# Patient Record
Sex: Male | Born: 1960
Health system: Southern US, Community
[De-identification: ages and names within clinical notes are randomized; demographics above are authoritative.]

## PROBLEM LIST (undated history)

## (undated) DIAGNOSIS — E782 Mixed hyperlipidemia: Secondary | ICD-10-CM

## (undated) DIAGNOSIS — I10 Essential (primary) hypertension: Secondary | ICD-10-CM

## (undated) DIAGNOSIS — F17200 Nicotine dependence, unspecified, uncomplicated: Secondary | ICD-10-CM

## (undated) HISTORY — DX: Mixed hyperlipidemia: E78.2

## (undated) HISTORY — DX: Essential (primary) hypertension: I10

## (undated) HISTORY — DX: Nicotine dependence, unspecified, uncomplicated: F17.200

## (undated) HISTORY — PX: NO PAST SURGERIES: SHX2092

---

## 2005-05-25 ENCOUNTER — Ambulatory Visit: Payer: Self-pay | Admitting: Family Medicine

## 2005-05-25 ENCOUNTER — Ambulatory Visit (HOSPITAL_COMMUNITY): Admission: RE | Admit: 2005-05-25 | Discharge: 2005-05-25 | Payer: Self-pay | Admitting: Family Medicine

## 2006-10-13 ENCOUNTER — Ambulatory Visit: Payer: Self-pay | Admitting: Family Medicine

## 2006-10-13 DIAGNOSIS — B078 Other viral warts: Secondary | ICD-10-CM | POA: Insufficient documentation

## 2006-11-08 ENCOUNTER — Ambulatory Visit: Payer: Self-pay | Admitting: Family Medicine

## 2006-11-09 IMAGING — CR DG WRIST COMPLETE 3+V*R*
4 series · 4 of 4 positions shown · non-contrast
Comparison: none

CLINICAL DATA: Right wrist trauma and pain.  Swelling. 
 RIGHT WRIST ? 4 VIEW:

[view not recorded (1 of 4)]
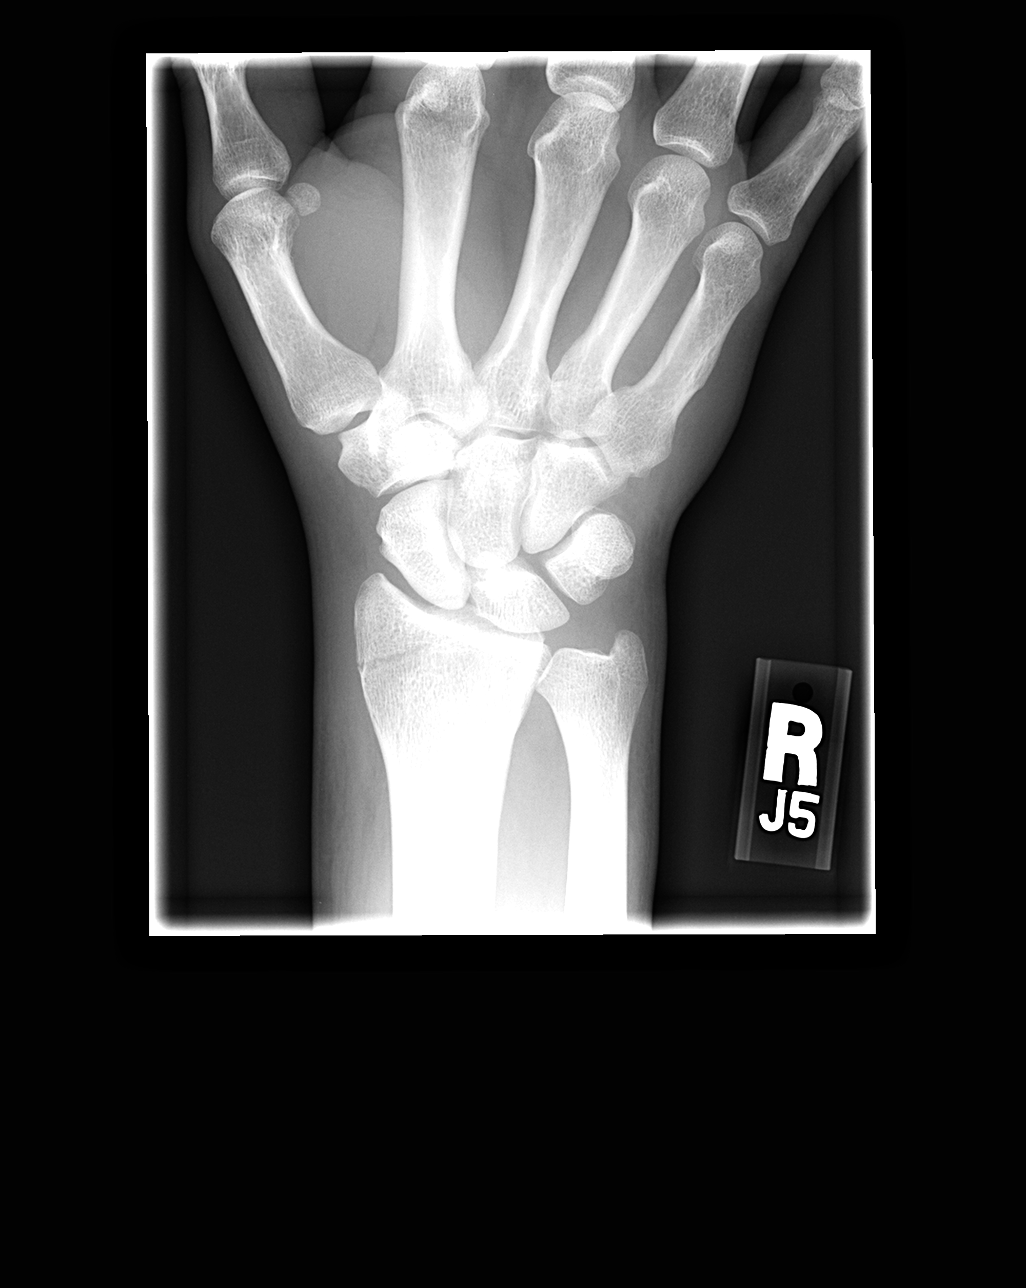

[view not recorded (2 of 4)]
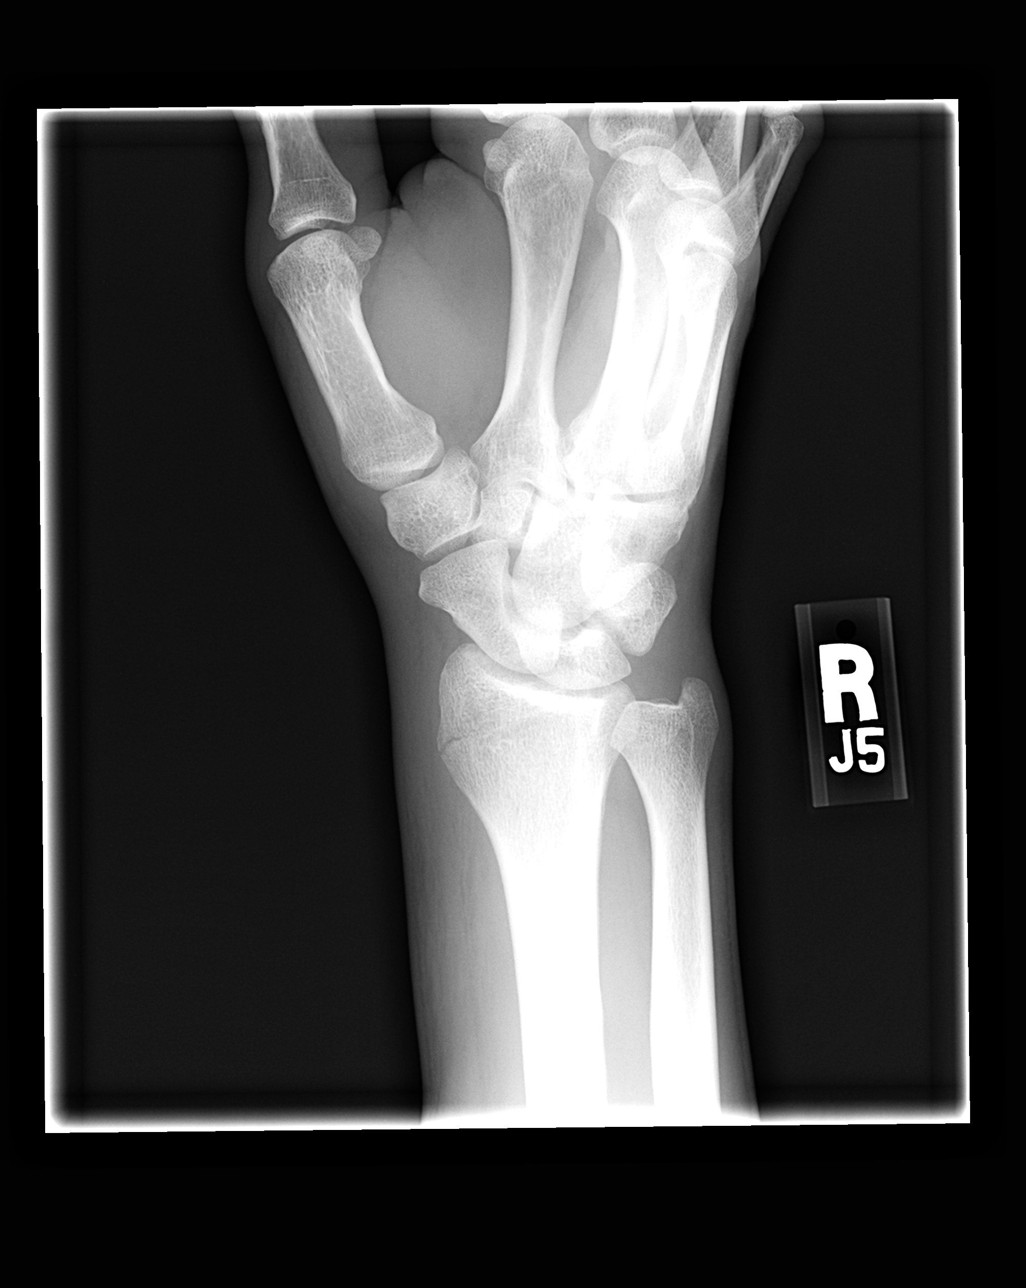

[view not recorded (3 of 4)]
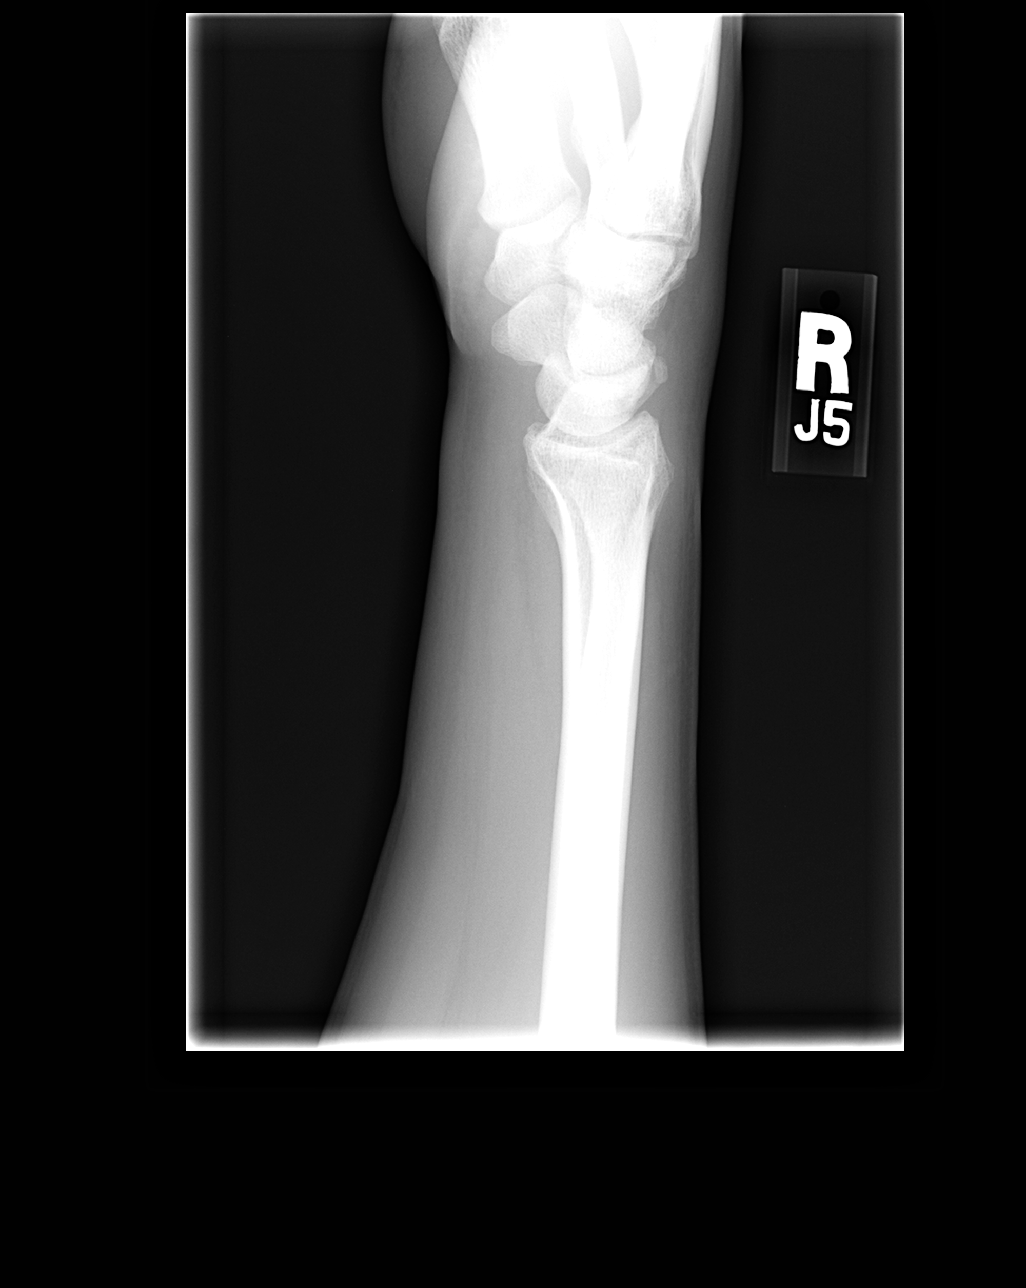

[view not recorded (4 of 4)]
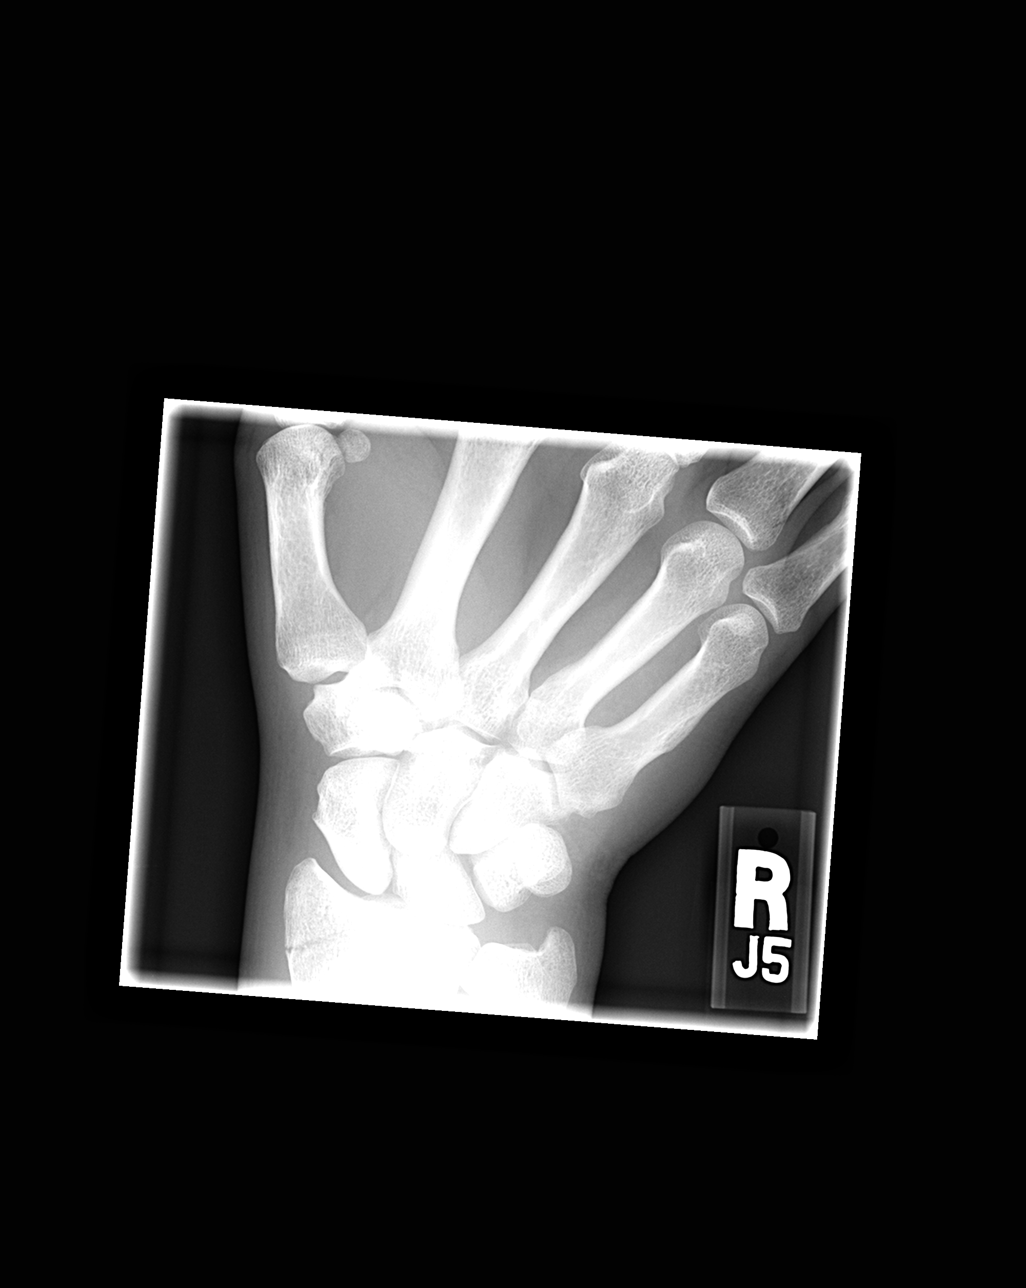

[4 of 4 positions shown; findings below may reference images not displayed]

FINDINGS: A nondisplaced oblique fracture is seen extending through the radial styloid process with intraarticular extension into the radiocarpal joint.  No other fractures are seen.  Alignment of the bones is normal.
IMPRESSION: Nondisplaced radial styloid fracture with intraarticular extension.

## 2015-02-20 ENCOUNTER — Encounter: Payer: Self-pay | Admitting: Podiatry

## 2015-02-20 ENCOUNTER — Ambulatory Visit (INDEPENDENT_AMBULATORY_CARE_PROVIDER_SITE_OTHER): Payer: BLUE CROSS/BLUE SHIELD | Admitting: Podiatry

## 2015-02-20 ENCOUNTER — Ambulatory Visit (INDEPENDENT_AMBULATORY_CARE_PROVIDER_SITE_OTHER): Payer: BLUE CROSS/BLUE SHIELD

## 2015-02-20 VITALS — BP 154/101 | HR 69 | Resp 12

## 2015-02-20 DIAGNOSIS — B078 Other viral warts: Secondary | ICD-10-CM

## 2015-02-20 DIAGNOSIS — M779 Enthesopathy, unspecified: Secondary | ICD-10-CM

## 2015-02-20 DIAGNOSIS — M7740 Metatarsalgia, unspecified foot: Secondary | ICD-10-CM | POA: Diagnosis not present

## 2015-02-20 DIAGNOSIS — B079 Viral wart, unspecified: Secondary | ICD-10-CM | POA: Diagnosis not present

## 2015-02-20 MED ORDER — TRIAMCINOLONE ACETONIDE 10 MG/ML IJ SUSP
10.0000 mg | Freq: Once | INTRAMUSCULAR | Status: AC
  Administered 2015-02-20: 10 mg

## 2015-02-20 NOTE — Progress Notes (Signed)
   Subjective:    Patient ID: Frank Ramsey, male    DOB: 12/16/1960, 54 y.o.   MRN: [id number]  HPI Pt stated rt top of the foot is been painful for 5 months. Foot is better but still hurts especially in the morning. Tried cortisone prescribe DR. IN [place].  ALSO, RT BALL OF THE FOOT HAVE PLANTARS WARTS.   Review of Systems  HENT: Positive for sinus pressure.        Objective:   Physical Exam        Assessment & Plan:

## 2015-02-22 NOTE — Progress Notes (Signed)
Subjective:     Patient ID: Frank Ramsey, male   DOB: 06/23/1960, 54 y.o.   MRN: [id number]  HPI patient states I'm getting a lot of pain on the top of my right foot over my left foot and also I have this lesion on the bottom of this right foot that's been present for several years and the attempted to freeze it and put acid on it without improvement    Review of Systems     Objective:   Physical Exam  Constitutional: He is oriented to person, place, and time.  Cardiovascular: Intact distal pulses.   Musculoskeletal: Normal range of motion.  Neurological: He is oriented to person, place, and time.  Skin: Skin is warm.  Nursing note and vitals reviewed.  neurovascular status intact muscle strength adequate range of motion within normal limits with patient found to have inflammation in the dorsum of the right foot around the extensor tendon complex midtarsal joint and lesion underneath the right foot around the fourth metatarsal which measures approximately 1 cm x 1 cm and upon debridement shows pinpoint bleeding and pain to lateral pressure found to have good digital perfusion well oriented 3     Assessment:     Verruca plantaris plantar aspect right foot with tendinitis of the dorsal midtarsal joint    Plan:     H&P and x-rays reviewed with patient. Today I injected the dorsal tendon complex 3 mg Kenalog 5 mg Xylocaine and I debrided the plantar lesion and applied chemical agent to create an immune response and applied sterile dressing. Instructed what to do if any blistering should occur and reappoint in 1 month

## 2015-04-03 ENCOUNTER — Ambulatory Visit: Payer: BLUE CROSS/BLUE SHIELD | Admitting: Podiatry

## 2017-04-26 DIAGNOSIS — J3089 Other allergic rhinitis: Secondary | ICD-10-CM | POA: Insufficient documentation

## 2017-04-26 DIAGNOSIS — J324 Chronic pansinusitis: Secondary | ICD-10-CM | POA: Insufficient documentation

## 2017-04-26 DIAGNOSIS — J300 Vasomotor rhinitis: Secondary | ICD-10-CM | POA: Insufficient documentation

## 2017-04-26 DIAGNOSIS — J342 Deviated nasal septum: Secondary | ICD-10-CM | POA: Insufficient documentation

## 2017-04-26 DIAGNOSIS — J343 Hypertrophy of nasal turbinates: Secondary | ICD-10-CM | POA: Insufficient documentation

## 2017-05-10 DIAGNOSIS — K219 Gastro-esophageal reflux disease without esophagitis: Secondary | ICD-10-CM | POA: Insufficient documentation

## 2017-05-10 DIAGNOSIS — R1314 Dysphagia, pharyngoesophageal phase: Secondary | ICD-10-CM | POA: Insufficient documentation

## 2019-05-23 DIAGNOSIS — Z23 Encounter for immunization: Secondary | ICD-10-CM | POA: Diagnosis not present

## 2019-06-04 ENCOUNTER — Ambulatory Visit: Payer: BC Managed Care – PPO

## 2019-06-04 ENCOUNTER — Encounter: Payer: Self-pay | Admitting: Orthopaedic Surgery

## 2019-06-04 ENCOUNTER — Other Ambulatory Visit: Payer: Self-pay

## 2019-06-04 ENCOUNTER — Ambulatory Visit: Payer: BC Managed Care – PPO | Admitting: Orthopaedic Surgery

## 2019-06-04 VITALS — BP 188/103 | HR 84 | Ht 70.0 in | Wt 170.0 lb

## 2019-06-04 DIAGNOSIS — M542 Cervicalgia: Secondary | ICD-10-CM

## 2019-06-04 DIAGNOSIS — F17210 Nicotine dependence, cigarettes, uncomplicated: Secondary | ICD-10-CM

## 2019-06-04 MED ORDER — NAPROXEN 500 MG PO TABS: 500.0000 mg | ORAL_TABLET | Freq: Two times a day (BID) | ORAL | 5 refills | Status: DC

## 2019-06-04 NOTE — Patient Instructions (Signed)
Steps to Quit Smoking Smoking tobacco is the leading cause of preventable death. It can affect almost every organ in the body. Smoking puts you and people around you at risk for many serious, long-lasting (chronic) diseases. Quitting smoking can be hard, but it is one of the best things that you can do for your health. It is never too late to quit. How do I get ready to quit? When you decide to quit smoking, make a plan to help you succeed. Before you quit:  Pick a date to quit. Set a date within the next 2 weeks to give you time to prepare.  Write down the reasons why you are quitting. Keep this list in places where you will see it often.  Tell your family, friends, and co-workers that you are quitting. Their support is important.  Talk with your doctor about the choices that may help you quit.  Find out if your health insurance will pay for these treatments.  Know the people, places, things, and activities that make you want to smoke (triggers). Avoid them. What first steps can I take to quit smoking?  Throw away all cigarettes at home, at work, and in your car.  Throw away the things that you use when you smoke, such as ashtrays and lighters.  Clean your car. Make sure to empty the ashtray.  Clean your home, including curtains and carpets. What can I do to help me quit smoking? Talk with your doctor about taking medicines and seeing a counselor at the same time. You are more likely to succeed when you do both.  If you are pregnant or breastfeeding, talk with your doctor about counseling or other ways to quit smoking. Do not take medicine to help you quit smoking unless your doctor tells you to do so. To quit smoking: Quit right away  Quit smoking totally, instead of slowly cutting back on how much you smoke over a period of time.  Go to counseling. You are more likely to quit if you go to counseling sessions regularly. Take medicine You may take medicines to help you quit. Some  medicines need a prescription, and some you can buy over-the-counter. Some medicines may contain a drug called nicotine to replace the nicotine in cigarettes. Medicines may:  Help you to stop having the desire to smoke (cravings).  Help to stop the problems that come when you stop smoking (withdrawal symptoms). Your doctor may ask you to use:  Nicotine patches, gum, or lozenges.  Nicotine inhalers or sprays.  Non-nicotine medicine that is taken by mouth. Find resources Find resources and other ways to help you quit smoking and remain smoke-free after you quit. These resources are most helpful when you use them often. They include:  Online chats with a counselor.  Phone quitlines.  Printed self-help materials.  Support groups or group counseling.  Text messaging programs.  Mobile phone apps. Use apps on your mobile phone or tablet that can help you stick to your quit plan. There are many free apps for mobile phones and tablets as well as websites. Examples include Quit Guide from the CDC and smokefree.gov  What things can I do to make it easier to quit?   Talk to your family and friends. Ask them to support and encourage you.  Call a phone quitline (1-800-QUIT-NOW), reach out to support groups, or work with a counselor.  Ask people who smoke to not smoke around you.  Avoid places that make you want to smoke,   such as: ? Bars. ? Parties. ? Smoke-break areas at work.  Spend time with people who do not smoke.  Lower the stress in your life. Stress can make you want to smoke. Try these things to help your stress: ? Getting regular exercise. ? Doing deep-breathing exercises. ? Doing yoga. ? Meditating. ? Doing a body scan. To do this, close your eyes, focus on one area of your body at a time from head to toe. Notice which parts of your body are tense. Try to relax the muscles in those areas. How will I feel when I quit smoking? Day 1 to 3 weeks Within the first 24 hours,  you may start to have some problems that come from quitting tobacco. These problems are very bad 2-3 days after you quit, but they do not often last for more than 2-3 weeks. You may get these symptoms:  Mood swings.  Feeling restless, nervous, angry, or annoyed.  Trouble concentrating.  Dizziness.  Strong desire for high-sugar foods and nicotine.  Weight gain.  Trouble pooping (constipation).  Feeling like you may vomit (nausea).  Coughing or a sore throat.  Changes in how the medicines that you take for other issues work in your body.  Depression.  Trouble sleeping (insomnia). Week 3 and afterward After the first 2-3 weeks of quitting, you may start to notice more positive results, such as:  Better sense of smell and taste.  Less coughing and sore throat.  Slower heart rate.  Lower blood pressure.  Clearer skin.  Better breathing.  Fewer sick days. Quitting smoking can be hard. Do not give up if you fail the first time. Some people need to try a few times before they succeed. Do your best to stick to your quit plan, and talk with your doctor if you have any questions or concerns. Summary  Smoking tobacco is the leading cause of preventable death. Quitting smoking can be hard, but it is one of the best things that you can do for your health.  When you decide to quit smoking, make a plan to help you succeed.  Quit smoking right away, not slowly over a period of time.  When you start quitting, seek help from your doctor, family, or friends. This information is not intended to replace advice given to you by your health care provider. Make sure you discuss any questions you have with your health care provider. Document Revised: 11/23/2018 Document Reviewed: 05/19/2018 Elsevier Patient Education  2020 Elsevier Inc.  

## 2019-06-04 NOTE — Progress Notes (Signed)
Subjective:    Patient ID: Frank Ramsey, male    DOB: 04-08-1960, 59 y.o.   MRN: [id number]  HPI  He has had right shoulder pain for several months but over the last few weeks he has had upper trapezius pain on the right with neck pain in certain motions.  He has no trauma.  He has no swelling.  He has used pain patches and Tylenol with some help.      Review of Systems  Constitutional: Positive for activity change.  Musculoskeletal: Positive for arthralgias and neck pain.  All other systems reviewed and are negative.  For Review of Systems, all other systems reviewed and are negative.  The following is a summary of the past history medically, past history surgically, known current medicines, social history and family history.  This information is gathered electronically by the computer from prior information and documentation.  I review this each visit and have found including this information at this point in the chart is beneficial and informative.   History reviewed. No pertinent past medical history.  History reviewed. No pertinent surgical history.  No current outpatient medications on file prior to visit.   No current facility-administered medications on file prior to visit.    Social History   Socioeconomic History  . Marital status: Married    Spouse name: Not on file  . Number of children: Not on file  . Years of education: Not on file  . Highest education level: Not on file  Occupational History  . Not on file  Tobacco Use  . Smoking status: Current Every Day Smoker  . Smokeless tobacco: Never Used  Substance and Sexual Activity  . Alcohol use: No  . Drug use: No  . Sexual activity: Not on file  Other Topics Concern  . Not on file  Social History Narrative  . Not on file   Social Determinants of Health   Financial Resource Strain:   . Difficulty of Paying Living Expenses:   Food Insecurity:   . Worried About Charity fundraiser in the Last Year:    . Arboriculturist in the Last Year:   Transportation Needs:   . Film/video editor (Medical):   Marland Kitchen Lack of Transportation (Non-Medical):   Physical Activity:   . Days of Exercise per Week:   . Minutes of Exercise per Session:   Stress:   . Feeling of Stress :   Social Connections:   . Frequency of Communication with Friends and Family:   . Frequency of Social Gatherings with Friends and Family:   . Attends Religious Services:   . Active Member of Clubs or Organizations:   . Attends Archivist Meetings:   Marland Kitchen Marital Status:   Intimate Partner Violence:   . Fear of Current or Ex-Partner:   . Emotionally Abused:   Marland Kitchen Physically Abused:   . Sexually Abused:     Family History  Problem Relation Age of Onset  . Healthy Mother   . Healthy Father     BP (!) 188/103   Pulse 84   Ht 5\' 10"  (1.778 m)   Wt 170 lb (77.1 kg)   BMI 24.39 kg/m   Body mass index is 24.39 kg/m.      Objective:   Physical Exam Vitals and nursing note reviewed.  Constitutional:      Appearance: He is well-developed.  HENT:     Head: Normocephalic and atraumatic.  Eyes:  Conjunctiva/sclera: Conjunctivae normal.     Pupils: Pupils are equal, round, and reactive to light.  Cardiovascular:     Rate and Rhythm: Normal rate and regular rhythm.  Pulmonary:     Effort: Pulmonary effort is normal.  Abdominal:     Palpations: Abdomen is soft.  Musculoskeletal:       Arms:     Cervical back: Normal range of motion and neck supple.  Skin:    General: Skin is warm and dry.  Neurological:     Mental Status: He is alert and oriented to person, place, and time.     Cranial Nerves: No cranial nerve deficit.     Motor: No abnormal muscle tone.     Coordination: Coordination normal.     Deep Tendon Reflexes: Reflexes are normal and symmetric. Reflexes normal.  Psychiatric:        Behavior: Behavior normal.        Thought Content: Thought content normal.        Judgment: Judgment  normal.    X-rays were done of the cervical spine, reported separately.       Assessment & Plan:   Encounter Diagnoses  Name Primary?  . Neck pain Yes  . Nicotine dependence, cigarettes, uncomplicated     Begin PT.  Begin Naprosyn 500 po bid pc.  Return in two weeks.  Call if any problem.  Precautions discussed.   Electronically Signed Darreld Mclean, MD 3/23/202110:04 AM

## 2019-06-07 ENCOUNTER — Encounter (HOSPITAL_COMMUNITY): Payer: Self-pay | Admitting: Physical Therapy

## 2019-06-07 ENCOUNTER — Ambulatory Visit (HOSPITAL_COMMUNITY): Payer: BC Managed Care – PPO | Attending: Orthopaedic Surgery | Admitting: Physical Therapy

## 2019-06-07 ENCOUNTER — Other Ambulatory Visit: Payer: Self-pay

## 2019-06-07 DIAGNOSIS — M542 Cervicalgia: Secondary | ICD-10-CM | POA: Diagnosis not present

## 2019-06-07 DIAGNOSIS — R29898 Other symptoms and signs involving the musculoskeletal system: Secondary | ICD-10-CM | POA: Insufficient documentation

## 2019-06-07 DIAGNOSIS — M25611 Stiffness of right shoulder, not elsewhere classified: Secondary | ICD-10-CM | POA: Insufficient documentation

## 2019-06-07 DIAGNOSIS — M79601 Pain in right arm: Secondary | ICD-10-CM | POA: Insufficient documentation

## 2019-06-07 NOTE — Patient Instructions (Signed)
Medbridge access code: [email or web address]

## 2019-06-07 NOTE — Therapy (Addendum)
[facility] [place], [place], [address] Phone: [phone]   Fax:  [phone]  Physical Therapy Evaluation  Patient Details  Name: Frank Ramsey MRN: [id number] Date of Birth: August 17, 1960 Referring Provider (PT): Sanjuana Kava, MD   Encounter Date: 06/07/2019  PT End of Session - 06/07/19 1537    Visit Number  1    Number of Visits  12    Date for PT Re-Evaluation  07/19/19    Authorization Type  [employer] (No auth required; no visit limit)    Authorization Time Period  06/07/19 - 07/19/19    Progress Note Due on Visit  10    PT Start Time  3810    PT Stop Time  1521    PT Time Calculation (min)  36 min    Activity Tolerance  Patient tolerated treatment well    Behavior During Therapy  [facility] for tasks assessed/performed       History reviewed. No pertinent past medical history.  History reviewed. No pertinent surgical history.  There were no vitals filed for this visit.   Subjective Assessment - 06/07/19 1453    Diagnostic tests  X-ray: Cervical negative    Patient Stated Goals  To have less pain and be able to use his arms normally    Currently in Pain?  Yes    Pain Score  2     Pain Location  Neck    Pain Orientation  Right    Pain Descriptors / Indicators  Aching    Pain Type  Chronic pain    Pain Onset  More than a month ago    Pain Frequency  Constant    Aggravating Factors   Lifting, looking up    Pain Relieving Factors  Laying down    Effect of Pain on Daily Activities  Moderately affects       Patient reported he has had neck pain and radiating RT arm pain for several months now. He reported that lifting and attempting to do overhead activities is very difficult. He denied any changes in swallowing, speech, or any dizziness. Patient is not sure what caused the initial difficulty with his neck and radiating pain.    [facility] PT Assessment - 06/07/19 0001      Assessment   Medical Diagnosis  Neck Pain     Referring Provider (PT)  Sanjuana Kava, MD    Onset Date/Surgical Date  --   4-5 months   Hand Dominance  Right    Next MD Visit  06/21/19    Prior Therapy  None      Precautions   Precautions  None      Restrictions   Weight Bearing Restrictions  No      Balance Screen   Has the patient fallen in the past 6 months  No    Has the patient had a decrease in activity level because of a fear of falling?   No    Is the patient reluctant to leave their home because of a fear of falling?   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 WFL Flexion and abduction. IR limited on RT to T10 LT can go to T6. ER C7 bilaterally no pain. Horizontal adduction pain on RT side.     AROM Assessment Site  Cervical    Cervical Flexion  65   No pain   Cervical Extension  50 painful    Cervical - Right Side Bend  30 painful    Cervical - Left Side Bend  35 no pain    Cervical - Right Rotation  75    Cervical - Left Rotation  75      Strength   Overall Strength Comments  Shoulder strength WFL      Palpation   Spinal mobility  Hypomobility of lower cervical and thoracic spine with CPAs    Palpation comment  Patient reported CPAs of thoracic spine felt good.  Noted muscular restrictions in right periscapular muscle. Trigger point noted.      Special Tests    Special Tests  Cervical    Cervical Tests  Spurling's;Dictraction      Spurling's   Findings  Negative    Comment  RT side negative. LT side recreated pain on the RT side.       Distraction Test   Findngs  Negative    Comment  Negative. No change in symptoms                Objective measurements completed on examination: See above findings.              PT Education - 06/07/19 1536    Education Details  Educated on examination findings, POC, and initial HEP.    Person(s) Educated  Patient    Methods  Explanation;Handout    Comprehension  Verbalized understanding       PT Short Term Goals - 06/07/19 1539      PT SHORT TERM GOAL #1   Title  Patient will report understanding and regular compliance with HEP to improve overall mobility and decrease pain.    Time  3    Period  Weeks    Status  New    Target Date  06/28/19      PT SHORT TERM GOAL #2   Title  Patient will report overall improvment of symptoms of at least 25% for improved QOL.    Time  3    Period  Weeks    Status  New    Target Date  06/28/19        PT Long Term Goals - 06/07/19 1540      PT LONG TERM GOAL #1   Title  Patient will report overall improvment of symptoms of at least 50% for improved QOL.    Time  6    Period  Weeks    Status  New    Target Date  07/19/19      PT LONG TERM GOAL #2  Title  Patient will demonstrate pain-free shoulder AROM to [facility] in order for improved ease of performing overhead reaching and future job activities.    Time  6    Period  Weeks    Status  New    Target Date  07/19/19      PT LONG TERM GOAL #3   Title  Patient will demonstrate pain-free cervical AROM to [facility] in order for improved ease of looking up and performing future job activities.    Time  6    Period  Weeks    Status  New    Target Date  07/19/19             Plan -  06/07/19 1551    Clinical Impression Statement  Patient is a 59 year old male who presents to outpatient physical therapy with primary concerns of right shoulder and right-sided neck pain which has been ongoing for several months. Patient demonstrated decreased right shoulder AROM as well as decreased cervical AROM and pain reported with these. Noted hypomobility of the thoracic and lower cervical spine as well as increased muscular restrictions in the right periscapular muscles. Educated patient on initial HEP as well as benefits of physical therapy. Patient would benefit from continued skilled physical therapy in order to address the abovementioned deficits and help patient return to his PLOF.    Personal Factors and Comorbidities  Age;Time since onset of injury/illness/exacerbation    Examination-Activity Limitations  Lift;Reach Overhead;Carry    Examination-Participation Restrictions  Yard Work;Cleaning;Community Activity    Stability/Clinical Decision Making  Stable/Uncomplicated    Clinical Decision Making  Low    Rehab Potential  Good    PT Frequency  2x / week    PT Duration  6 weeks    PT Treatment/Interventions  ADLs/Self Care Home Management;Aquatic Therapy;Cryotherapy;Electrical Stimulation;Moist Heat;Traction;Functional mobility training;Therapeutic activities;Therapeutic exercise;Patient/family education;Orthotic Fit/Training;Manual techniques;Passive range of motion;Dry needling;Taping    PT Next Visit Plan  Review eval/goals. Perform FOTO. Review HEP. Focus on STM to decrease muscular restrictions in RT periscapular mms.    PT Home Exercise Plan  06/07/19: Trapezius stretch 3x30'' daily    Consulted and Agree with Plan of Care  Patient       Patient will benefit from skilled therapeutic intervention in order to improve the following deficits and impairments:  Increased fascial restricitons, Improper body mechanics, Pain, Decreased mobility, Increased muscle spasms, Postural  dysfunction, Decreased activity tolerance, Decreased endurance, Decreased range of motion, Hypomobility, Impaired UE functional use  Visit Diagnosis: Cervicalgia  Stiffness of right shoulder, not elsewhere classified  Pain in right arm  Other symptoms and signs involving the musculoskeletal system     Problem List Patient Active Problem List   Diagnosis Date Noted  . Laryngopharyngeal reflux (LPR) 05/10/2017  . Pharyngoesophageal dysphagia 05/10/2017  . Chronic pansinusitis 04/26/2017  . Deviated septum 04/26/2017  . Perennial allergic rhinitis 04/26/2017  . Hypertrophy, nasal, turbinate 04/26/2017  . Vasomotor rhinitis 04/26/2017  . PLANTAR WART 10/13/2006   Verne Carrow PT, DPT 3:56 PM, 06/07/19 [phone]  [facility] [address] [place], [place], [address] Phone: [phone]   Fax:  [phone]  Name: LARAY CORBIT MRN: [id number] Date of Birth: 05/11/1960

## 2019-06-11 ENCOUNTER — Encounter (HOSPITAL_COMMUNITY): Payer: Self-pay | Admitting: Physical Therapy

## 2019-06-11 ENCOUNTER — Other Ambulatory Visit: Payer: Self-pay

## 2019-06-11 ENCOUNTER — Ambulatory Visit (HOSPITAL_COMMUNITY): Payer: BC Managed Care – PPO | Admitting: Physical Therapy

## 2019-06-11 DIAGNOSIS — R29898 Other symptoms and signs involving the musculoskeletal system: Secondary | ICD-10-CM | POA: Diagnosis not present

## 2019-06-11 DIAGNOSIS — M79601 Pain in right arm: Secondary | ICD-10-CM

## 2019-06-11 DIAGNOSIS — M25611 Stiffness of right shoulder, not elsewhere classified: Secondary | ICD-10-CM

## 2019-06-11 DIAGNOSIS — M542 Cervicalgia: Secondary | ICD-10-CM | POA: Diagnosis not present

## 2019-06-11 NOTE — Therapy (Signed)
[facility] [address] [place], [place], [address] Phone: [phone]   Fax:  [phone]  Physical Therapy Treatment  Patient Details  Name: Frank Ramsey MRN: [id number] Date of Birth: [birth year] Referring Provider (PT): Darreld Mclean, MD   Encounter Date: 06/11/2019  PT End of Session - 06/11/19 1635    Visit Number  2    Number of Visits  12    Date for PT Re-Evaluation  07/19/19    Authorization Type  BCBS (No auth required; no visit limit)    Authorization Time Period  06/07/19 - 07/19/19    Progress Note Due on Visit  10    PT Start Time  1436    PT Stop Time  1515    PT Time Calculation (min)  39 min    Activity Tolerance  Patient tolerated treatment well    Behavior During Therapy  [facility] for tasks assessed/performed       History reviewed. No pertinent past medical history.  History reviewed. No pertinent surgical history.  There were no vitals filed for this visit.  Subjective Assessment - 06/11/19 1441    Subjective  Patient reported that he is feeling alright, just slight discomfort 1/10 currently.    Diagnostic tests  X-ray: Cervical negative    Patient Stated Goals  To have less pain and be able to use his arms normally    Currently in Pain?  Yes    Pain Score  1     Pain Location  Neck    Pain Orientation  Right    Pain Descriptors / Indicators  Aching    Pain Type  Chronic pain    Pain Onset  More than a month ago         [facility] PT Assessment - 06/11/19 0001      Observation/Other Assessments   Focus on Therapeutic Outcomes (FOTO)   65%                   [facility] Adult PT Treatment/Exercise - 06/11/19 0001      Exercises   Exercises  Neck      Manual Therapy   Manual Therapy  Soft tissue mobilization    Manual therapy comments  all manual completed separately from other skilled interventions    Soft tissue mobilization  Patient seated STM to cervical perispinals and periscapular muscles RT>  LT to patient's tolerance      Neck Exercises: Stretches   Upper Trapezius Stretch  Right;Left;30 seconds;2 reps    Upper Trapezius Stretch Limitations  Seated arms under chair     Other Neck Stretches  Posterior shoulder rolls up back down with max VCs, TCs and demonstration for form             PT Education - 06/11/19 1633    Education Details  Discussed adding posterior shoulder rolls at home.    Person(s) Educated  Patient    Methods  Explanation    Comprehension  Verbalized understanding       PT Short Term Goals - 06/11/19 1442      PT SHORT TERM GOAL #1   Title  Patient will report understanding and regular compliance with HEP to improve overall mobility and decrease pain.    Time  3    Period  Weeks    Status  On-going    Target Date  06/28/19      PT SHORT TERM GOAL #2  Title  Patient will report overall improvment of symptoms of at least 25% for improved QOL.    Time  3    Period  Weeks    Status  On-going    Target Date  06/28/19        PT Long Term Goals - 06/11/19 1444      PT LONG TERM GOAL #1   Title  Patient will report overall improvment of symptoms of at least 50% for improved QOL.    Time  6    Period  Weeks    Status  On-going      PT LONG TERM GOAL #2   Title  Patient will demonstrate pain-free shoulder AROM to [facility] in order for improved ease of performing overhead reaching and future job activities.    Time  6    Period  Weeks    Status  On-going      PT LONG TERM GOAL #3   Title  Patient will demonstrate pain-free cervical AROM to [facility] in order for improved ease of looking up and performing future job activities.    Time  6    Period  Weeks    Status  On-going            Plan - 06/11/19 1639    Clinical Impression Statement  Began by reviewing patient's goals and evaluation and performing FOTO. Educated patient on exercises including posterior shoulder rolls with cues to incorporate breathing and tactile verbal cues to  improve patient's form. Patient required significant cueing for sequencing of posterior shoulder rolls and had difficulty with motor coordination of movement. Ended with manual therapy with patient in sitting with noted restrictions in right periscapular and cervical perispinals. Patient reported feeling okay at end of session.    Personal Factors and Comorbidities  Age;Time since onset of injury/illness/exacerbation    Examination-Activity Limitations  Lift;Reach Overhead;Carry    Examination-Participation Restrictions  Yard Work;Cleaning;Community Activity    Stability/Clinical Decision Making  Stable/Uncomplicated    Rehab Potential  Good    PT Frequency  2x / week    PT Duration  6 weeks    PT Treatment/Interventions  ADLs/Self Care Home Management;Aquatic Therapy;Cryotherapy;Electrical Stimulation;Moist Heat;Traction;Functional mobility training;Therapeutic activities;Therapeutic exercise;Patient/family education;Orthotic Fit/Training;Manual techniques;Passive range of motion;Dry needling;Taping    PT Next Visit Plan  Focus on STM to decrease muscular restrictions in RT periscapular mms.    PT Home Exercise Plan  06/07/19: Trapezius stretch 3x30'' daily; 06/11/19: Posterior shoulder rolls x10    Consulted and Agree with Plan of Care  Patient       Patient will benefit from skilled therapeutic intervention in order to improve the following deficits and impairments:  Increased fascial restricitons, Improper body mechanics, Pain, Decreased mobility, Increased muscle spasms, Postural dysfunction, Decreased activity tolerance, Decreased endurance, Decreased range of motion, Hypomobility, Impaired UE functional use  Visit Diagnosis: Cervicalgia  Stiffness of right shoulder, not elsewhere classified  Pain in right arm  Other symptoms and signs involving the musculoskeletal system     Problem List Patient Active Problem List   Diagnosis Date Noted  . Laryngopharyngeal reflux (LPR)  05/10/2017  . Pharyngoesophageal dysphagia 05/10/2017  . Chronic pansinusitis 04/26/2017  . Deviated septum 04/26/2017  . Perennial allergic rhinitis 04/26/2017  . Hypertrophy, nasal, turbinate 04/26/2017  . Vasomotor rhinitis 04/26/2017  . PLANTAR WART 10/13/2006   Clarene Critchley PT, DPT 4:41 PM, 06/11/19 [place], [place], [address]  Phone: [phone]   Fax:  [phone]  Name: AASIM RESTIVO MRN: [id number] Date of Birth: 1960/11/14

## 2019-06-13 ENCOUNTER — Other Ambulatory Visit: Payer: Self-pay

## 2019-06-13 ENCOUNTER — Ambulatory Visit (HOSPITAL_COMMUNITY): Payer: BC Managed Care – PPO | Attending: Orthopaedic Surgery | Admitting: Physical Therapy

## 2019-06-13 ENCOUNTER — Encounter (HOSPITAL_COMMUNITY): Payer: Self-pay | Admitting: Physical Therapy

## 2019-06-13 DIAGNOSIS — M25611 Stiffness of right shoulder, not elsewhere classified: Secondary | ICD-10-CM | POA: Insufficient documentation

## 2019-06-13 DIAGNOSIS — R29898 Other symptoms and signs involving the musculoskeletal system: Secondary | ICD-10-CM | POA: Diagnosis not present

## 2019-06-13 DIAGNOSIS — M542 Cervicalgia: Secondary | ICD-10-CM | POA: Diagnosis not present

## 2019-06-13 DIAGNOSIS — M79601 Pain in right arm: Secondary | ICD-10-CM | POA: Diagnosis not present

## 2019-06-13 NOTE — Therapy (Signed)
[place], [place], [address] Phone: [phone]   Fax:  [phone]  Physical Therapy Treatment  Patient Details  Name: Frank Ramsey MRN: [id number] Date of Birth: 04-03-1960 Referring Provider (PT): Sanjuana Kava, MD   Encounter Date: 06/13/2019  PT End of Session - 06/13/19 1442    Visit Number  3    Number of Visits  12    Date for PT Re-Evaluation  07/19/19    Authorization Type  BCBS (No auth required; no visit limit)    Authorization Time Period  06/07/19 - 07/19/19    Progress Note Due on Visit  10    PT Start Time  3151    PT Stop Time  1515    PT Time Calculation (min)  38 min    Activity Tolerance  Patient tolerated treatment well    Behavior During Therapy  [facility] for tasks assessed/performed       History reviewed. No pertinent past medical history.  History reviewed. No pertinent surgical history.  There were no vitals filed for this visit.  Subjective Assessment - 06/13/19 1439    Subjective  Patient reported that yesterday he was throwing fertilizer.    Diagnostic tests  X-ray: Cervical negative    Patient Stated Goals  To have less pain and be able to use his arms normally    Currently in Pain?  Yes    Pain Score  1     Pain Location  Neck    Pain Orientation  Right    Pain Descriptors / Indicators  Aching    Pain Onset  More than a month ago                       [facility] Adult PT Treatment/Exercise - 06/13/19 0001      Neck Exercises: Theraband   Shoulder Extension  15 reps;Red    Shoulder Extension Limitations  2 sets     Rows  15 reps;Red    Rows Limitations  2 sets    Other Theraband Exercises  D2 flexion x15 each RTB      Neck Exercises: Seated   Other Seated Exercise  Posterior shoulder rolls x 10 minimal cueing      Manual Therapy   Manual Therapy  Soft tissue mobilization    Manual therapy comments  all manual completed separately from other skilled  interventions    Soft tissue mobilization  Patient seated STM to cervical perispinals and periscapular muscles RT> LT to patient's tolerance      Neck Exercises: Stretches   Upper Trapezius Stretch  Right;Left;30 seconds;2 reps    Upper Trapezius Stretch Limitations  Seated arms under chair                PT Short Term Goals - 06/11/19 1442      PT SHORT TERM GOAL #1   Title  Patient will report understanding and regular compliance with HEP to improve overall mobility and decrease pain.    Time  3    Period  Weeks    Status  On-going    Target Date  06/28/19      PT SHORT TERM GOAL #2   Title  Patient will report overall improvment of symptoms of at least 25% for improved QOL.    Time  3    Period  Weeks    Status  On-going    Target Date  06/28/19        PT Long Term Goals - 06/11/19 1444      PT LONG TERM GOAL #1   Title  Patient will report overall improvment of symptoms of at least 50% for improved QOL.    Time  6    Period  Weeks    Status  On-going      PT LONG TERM GOAL #2   Title  Patient will demonstrate pain-free shoulder AROM to [facility] in order for improved ease of performing overhead reaching and future job activities.    Time  6    Period  Weeks    Status  On-going      PT LONG TERM GOAL #3   Title  Patient will demonstrate pain-free cervical AROM to [facility] in order for improved ease of looking up and performing future job activities.    Time  6    Period  Weeks    Status  On-going            Plan - 06/13/19 1515    Clinical Impression Statement  Began by reviewing HEP. Patient demonstrated improved coordination with posterior shoulder rolls this session only requiring minimal cueing. Added postural strengthening exercises this session which patient required verbal and tactile cues to perform. Patient reported some increase in arm symptoms initially with rows, however reported that it was tolerable. Patient would benefit from continued skilled  physical therapy to continue progressing towards functional goals.    Personal Factors and Comorbidities  Age;Time since onset of injury/illness/exacerbation    Examination-Activity Limitations  Lift;Reach Overhead;Carry    Examination-Participation Restrictions  Yard Work;Cleaning;Community Activity    Stability/Clinical Decision Making  Stable/Uncomplicated    Rehab Potential  Good    PT Frequency  2x / week    PT Duration  6 weeks    PT Treatment/Interventions  ADLs/Self Care Home Management;Aquatic Therapy;Cryotherapy;Electrical Stimulation;Moist Heat;Traction;Functional mobility training;Therapeutic activities;Therapeutic exercise;Patient/family education;Orthotic Fit/Training;Manual techniques;Passive range of motion;Dry needling;Taping    PT Next Visit Plan  Focus on STM to decrease muscular restrictions in RT periscapular mms. Continue postural strengthening possible body blade    PT Home Exercise Plan  06/07/19: Trapezius stretch 3x30'' daily; 06/11/19: Posterior shoulder rolls x10    Consulted and Agree with Plan of Care  Patient       Patient will benefit from skilled therapeutic intervention in order to improve the following deficits and impairments:  Increased fascial restricitons, Improper body mechanics, Pain, Decreased mobility, Increased muscle spasms, Postural dysfunction, Decreased activity tolerance, Decreased endurance, Decreased range of motion, Hypomobility, Impaired UE functional use  Visit Diagnosis: Cervicalgia  Stiffness of right shoulder, not elsewhere classified  Pain in right arm  Other symptoms and signs involving the musculoskeletal system     Problem List Patient Active Problem List   Diagnosis Date Noted  . Laryngopharyngeal reflux (LPR) 05/10/2017  . Pharyngoesophageal dysphagia 05/10/2017  . Chronic pansinusitis 04/26/2017  . Deviated septum 04/26/2017  . Perennial allergic rhinitis 04/26/2017  . Hypertrophy, nasal, turbinate 04/26/2017  .  Vasomotor rhinitis 04/26/2017  . PLANTAR WART 10/13/2006   Verne Carrow PT, DPT 3:17 PM, 06/13/19 [phone]  [facility] [address] [place], [place], [address] Phone: [phone]   Fax:  [phone]  Name: JAMERE STIDHAM MRN: [id number] Date of Birth: [birth year]

## 2019-06-18 ENCOUNTER — Other Ambulatory Visit: Payer: Self-pay

## 2019-06-18 ENCOUNTER — Ambulatory Visit (HOSPITAL_COMMUNITY): Payer: BC Managed Care – PPO

## 2019-06-18 ENCOUNTER — Encounter (HOSPITAL_COMMUNITY): Payer: Self-pay

## 2019-06-18 DIAGNOSIS — M79601 Pain in right arm: Secondary | ICD-10-CM

## 2019-06-18 DIAGNOSIS — M25611 Stiffness of right shoulder, not elsewhere classified: Secondary | ICD-10-CM

## 2019-06-18 DIAGNOSIS — M542 Cervicalgia: Secondary | ICD-10-CM | POA: Diagnosis not present

## 2019-06-18 DIAGNOSIS — R29898 Other symptoms and signs involving the musculoskeletal system: Secondary | ICD-10-CM

## 2019-06-18 NOTE — Therapy (Signed)
[facility] [address] [place], [place], [address] Phone: [phone]   Fax:  [phone]  Physical Therapy Treatment  Patient Details  Name: Frank Ramsey MRN: [id number] Date of Birth: 17-Jan-1961 Referring Provider (PT): Darreld Mclean, MD   Encounter Date: 06/18/2019  PT End of Session - 06/18/19 1540    Visit Number  4    Number of Visits  12    Date for PT Re-Evaluation  07/19/19    Authorization Type  BCBS (No auth required; no visit limit)    Authorization Time Period  06/07/19 - 07/19/19    PT Start Time  1535    PT Stop Time  1615    PT Time Calculation (min)  40 min    Activity Tolerance  Patient tolerated treatment well    Behavior During Therapy  [facility] for tasks assessed/performed       History reviewed. No pertinent past medical history.  History reviewed. No pertinent surgical history.  There were no vitals filed for this visit.  Subjective Assessment - 06/18/19 1537    Subjective  Pt reports he has been busy today, pressure washed his garage this morning.  Current pain scale .5/10 Rt sided    Patient Stated Goals  To have less pain and be able to use his arms normally    Currently in Pain?  Yes    Pain Score  1     Pain Location  Neck    Pain Orientation  Right    Pain Descriptors / Indicators  --   pulsing   Pain Type  Chronic pain    Pain Onset  More than a month ago    Pain Frequency  Constant    Aggravating Factors   Lifting, looking up    Pain Relieving Factors  laying down    Effect of Pain on Daily Activities  moderately affects                       [facility] Adult PT Treatment/Exercise - 06/18/19 0001      Posture/Postural Control   Posture/Postural Control  Postural limitations    Postural Limitations  Rounded Shoulders;Forward head      Exercises   Exercises  Neck      Neck Exercises: Theraband   Shoulder Extension  15 reps;Red    Shoulder Extension Limitations  2 sets; cueing to  reduce forward head    Rows  15 reps;Red    Rows Limitations  2 sets; cueing to reduce forward head      Neck Exercises: Standing   Neck Retraction  10 reps    Neck Retraction Limitations  UE flexion 10x with cervical retraction      Neck Exercises: Seated   Neck Retraction  10 reps;3 secs    Other Seated Exercise  Posterior shoulder rolls x 10 minimal cueing      Manual Therapy   Manual Therapy  Soft tissue mobilization    Manual therapy comments  all manual completed separately from other skilled interventions    Soft tissue mobilization  Supine position: STM to UT, levator and scalenes, periscapular mm      Neck Exercises: Stretches   Upper Trapezius Stretch  Right;Left;30 seconds;2 reps    Upper Trapezius Stretch Limitations  Reviewed form for maximal benefits iwth HEP, cueing to look forward not rotate and use arm opposite sidebend.  PT Short Term Goals - 06/18/19 1559      PT SHORT TERM GOAL #1   Title  Patient will report understanding and regular compliance with HEP to improve overall mobility and decrease pain.    Baseline  06/18/19:  Reports compliance wiht HEP daily      PT SHORT TERM GOAL #2   Title  Patient will report overall improvment of symptoms of at least 25% for improved QOL.    Baseline  06/18/19:  Reports increased ability to do more functional activities, 15% improvements.        PT Long Term Goals - 06/11/19 1444      PT LONG TERM GOAL #1   Title  Patient will report overall improvment of symptoms of at least 50% for improved QOL.    Time  6    Period  Weeks    Status  On-going      PT LONG TERM GOAL #2   Title  Patient will demonstrate pain-free shoulder AROM to [facility] in order for improved ease of performing overhead reaching and future job activities.    Time  6    Period  Weeks    Status  On-going      PT LONG TERM GOAL #3   Title  Patient will demonstrate pain-free cervical AROM to [facility] in order for improved ease of  looking up and performing future job activities.    Time  6    Period  Weeks    Status  On-going            Plan - 06/18/19 1814    Clinical Impression Statement  Session focus on postural strengthening and reviewing mechanics to assure correct form with HEP.  Pt educated on proper form with UT stretches, able to demonstrate with reoprts of proper stretch.  Added cervical retraction exercises to address forward head with verbal and tactile cueing for proper form.  Pt demonstrates improved shoulder mobility WFLs and no reoprts of pain with any motion.  EOS wiht manual to address soft tissue restricitons cervical and periscapular mm.  No reports of pain thorugh session.    Personal Factors and Comorbidities  Age;Time since onset of injury/illness/exacerbation    Examination-Activity Limitations  Lift;Reach Overhead;Carry    Examination-Participation Restrictions  Yard Work;Cleaning;Community Activity    Stability/Clinical Decision Making  Stable/Uncomplicated    Clinical Decision Making  Low    Rehab Potential  Good    PT Frequency  2x / week    PT Duration  6 weeks    PT Treatment/Interventions  ADLs/Self Care Home Management;Aquatic Therapy;Cryotherapy;Electrical Stimulation;Moist Heat;Traction;Functional mobility training;Therapeutic activities;Therapeutic exercise;Patient/family education;Orthotic Fit/Training;Manual techniques;Passive range of motion;Dry needling;Taping    PT Next Visit Plan  Continue postural strengtheng.  Progressed UE strength, begin body blade next session.  COntinues STM to Rt periscapular mm.    PT Home Exercise Plan  06/07/19: Trapezius stretch 3x30'' daily; 06/11/19: Posterior shoulder rolls x10       Patient will benefit from skilled therapeutic intervention in order to improve the following deficits and impairments:  Increased fascial restricitons, Improper body mechanics, Pain, Decreased mobility, Increased muscle spasms, Postural dysfunction, Decreased  activity tolerance, Decreased endurance, Decreased range of motion, Hypomobility, Impaired UE functional use  Visit Diagnosis: Cervicalgia  Stiffness of right shoulder, not elsewhere classified  Pain in right arm  Other symptoms and signs involving the musculoskeletal system     Problem List Patient Active Problem List   Diagnosis Date Noted  .  Laryngopharyngeal reflux (LPR) 05/10/2017  . Pharyngoesophageal dysphagia 05/10/2017  . Chronic pansinusitis 04/26/2017  . Deviated septum 04/26/2017  . Perennial allergic rhinitis 04/26/2017  . Hypertrophy, nasal, turbinate 04/26/2017  . Vasomotor rhinitis 04/26/2017  . PLANTAR WART 10/13/2006   Becky Sax, LPTA/CLT; CBIS [phone]  Juel Burrow 06/18/2019, 6:19 PM  [facility] [address] [place], [place], [address] Phone: [phone]   Fax:  [phone]  Name: Frank Ramsey MRN: [id number] Date of Birth: [birth year]

## 2019-06-20 ENCOUNTER — Encounter: Payer: Self-pay | Admitting: Orthopaedic Surgery

## 2019-06-20 ENCOUNTER — Ambulatory Visit (HOSPITAL_COMMUNITY): Payer: BC Managed Care – PPO | Admitting: Physical Therapy

## 2019-06-20 ENCOUNTER — Ambulatory Visit: Payer: BC Managed Care – PPO | Admitting: Orthopaedic Surgery

## 2019-06-20 ENCOUNTER — Telehealth (HOSPITAL_COMMUNITY): Payer: Self-pay | Admitting: Physical Therapy

## 2019-06-20 ENCOUNTER — Other Ambulatory Visit: Payer: Self-pay

## 2019-06-20 VITALS — BP 154/94 | HR 87 | Temp 97.8°F | Ht 70.0 in | Wt 172.2 lb

## 2019-06-20 DIAGNOSIS — M25511 Pain in right shoulder: Secondary | ICD-10-CM | POA: Diagnosis not present

## 2019-06-20 DIAGNOSIS — G8929 Other chronic pain: Secondary | ICD-10-CM | POA: Diagnosis not present

## 2019-06-20 DIAGNOSIS — F17210 Nicotine dependence, cigarettes, uncomplicated: Secondary | ICD-10-CM | POA: Diagnosis not present

## 2019-06-20 DIAGNOSIS — M542 Cervicalgia: Secondary | ICD-10-CM | POA: Diagnosis not present

## 2019-06-20 NOTE — Patient Instructions (Signed)

## 2019-06-20 NOTE — Progress Notes (Signed)
Patient Frank Ramsey, male DOB:05/09/1960, 59 y.o. ZSW:[id number]  Chief Complaint  Patient presents with  . Neck Pain     its better    HPI  Frank Ramsey is a 59 y.o. male who has neck and right shoulder pain.  He has been to PT.  I have reviewed the notes.  He is much better. He is sleeping well.  He is doing his exercises at home.  He has some tenderness but is pleased with his progress. He used a pressure washer yesterday and did well with it.   Body mass index is 24.72 kg/m.  ROS  Review of Systems  Constitutional: Positive for activity change.  Musculoskeletal: Positive for arthralgias and neck pain.  All other systems reviewed and are negative.   All other systems reviewed and are negative.  The following is a summary of the past history medically, past history surgically, known current medicines, social history and family history.  This information is gathered electronically by the computer from prior information and documentation.  I review this each visit and have found including this information at this point in the chart is beneficial and informative.    History reviewed. No pertinent past medical history.  History reviewed. No pertinent surgical history.  Family History  Problem Relation Age of Onset  . Healthy Mother   . Healthy Father     Social History Social History   Tobacco Use  . Smoking status: Current Every Day Smoker  . Smokeless tobacco: Never Used  Substance Use Topics  . Alcohol use: No  . Drug use: No    No Known Allergies  Current Outpatient Medications  Medication Sig Dispense Refill  . naproxen (NAPROSYN) 500 MG tablet Take 1 tablet (500 mg total) by mouth 2 (two) times daily with a meal. 60 tablet 5   No current facility-administered medications for this visit.     Physical Exam  Blood pressure (!) 154/94, pulse 87, temperature 97.8 F (36.6 C), height 5\' 10"  (1.778 m), weight 172 lb 4 oz (78.1 kg).  Constitutional:  overall normal hygiene, normal nutrition, well developed, normal grooming, normal body habitus. Assistive device:none  Musculoskeletal: gait and station Limp none, muscle tone and strength are normal, no tremors or atrophy is present.  .  Neurological: coordination overall normal.  Deep tendon reflex/nerve stretch intact.  Sensation normal.  Cranial nerves II-XII intact.   Skin:   Normal overall no scars, lesions, ulcers or rashes. No psoriasis.  Psychiatric: Alert and oriented x 3.  Recent memory intact, remote memory unclear.  Normal mood and affect. Well groomed.  Good eye contact.  Cardiovascular: overall no swelling, no varicosities, no edema bilaterally, normal temperatures of the legs and arms, no clubbing, cyanosis and good capillary refill.  Lymphatic: palpation is normal.  He has full ROM of the neck and right shoulder.  NV intact.  All other systems reviewed and are negative   The patient has been educated about the nature of the problem(s) and counseled on treatment options.  The patient appeared to understand what I have discussed and is in agreement with it.  Encounter Diagnoses  Name Primary?  . Neck pain Yes  . Chronic right shoulder pain   . Nicotine dependence, cigarettes, uncomplicated     PLAN Call if any problems.  Precautions discussed.  Continue current medications.   Return to clinic 1 month   Call and cancel if doing well in a month.  Electronically Signed , MD  4/8/202111:06 AM

## 2019-06-20 NOTE — Telephone Encounter (Signed)
pt just left Dr. Hilda Lias office and he told him that he no longer needs PT, so he has cancelled his remaining appts

## 2019-06-22 DIAGNOSIS — Z23 Encounter for immunization: Secondary | ICD-10-CM | POA: Diagnosis not present

## 2019-06-25 ENCOUNTER — Ambulatory Visit (HOSPITAL_COMMUNITY): Payer: BC Managed Care – PPO | Admitting: Physical Therapy

## 2019-06-27 ENCOUNTER — Ambulatory Visit (HOSPITAL_COMMUNITY): Payer: BC Managed Care – PPO | Admitting: Physical Therapy

## 2019-07-02 ENCOUNTER — Encounter (HOSPITAL_COMMUNITY): Payer: BC Managed Care – PPO | Admitting: Physical Therapy

## 2019-07-03 DIAGNOSIS — M25511 Pain in right shoulder: Secondary | ICD-10-CM | POA: Diagnosis not present

## 2019-07-03 DIAGNOSIS — Z23 Encounter for immunization: Secondary | ICD-10-CM | POA: Diagnosis not present

## 2019-07-03 DIAGNOSIS — Z0189 Encounter for other specified special examinations: Secondary | ICD-10-CM | POA: Diagnosis not present

## 2019-07-04 ENCOUNTER — Encounter (HOSPITAL_COMMUNITY): Payer: BC Managed Care – PPO | Admitting: Physical Therapy

## 2019-07-09 ENCOUNTER — Encounter (HOSPITAL_COMMUNITY): Payer: BC Managed Care – PPO | Admitting: Physical Therapy

## 2019-07-11 ENCOUNTER — Encounter (HOSPITAL_COMMUNITY): Payer: BC Managed Care – PPO | Admitting: Physical Therapy

## 2019-07-12 ENCOUNTER — Encounter (HOSPITAL_COMMUNITY): Payer: Self-pay | Admitting: Physical Therapy

## 2019-07-12 NOTE — Therapy (Signed)
[place], [place], [address] Phone: [phone]   Fax:  [phone]  Patient Details  Name: Frank Ramsey MRN: [id number] Date of Birth: [birth year] Referring Provider:  No ref. provider found  Encounter Date: 07/12/2019   PHYSICAL THERAPY DISCHARGE SUMMARY  Visits from Start of Care: 4  Current functional level related to goals / functional outcomes: Unknown as patient did not return for re-assessment   Remaining deficits: Unknown as patient did not return for re-assessment   Education / Equipment: HEP Plan: Patient agrees to discharge.  Patient goals were not met. Patient is being discharged due to                                                     ?????     Patient reporting MD told him he does not need to continue physical therapy   Clarene Critchley PT, DPT 3:00 PM, 07/12/19 [place], [place], [address] Phone: [phone]   Fax:  [phone]

## 2019-07-16 ENCOUNTER — Encounter (HOSPITAL_COMMUNITY): Payer: BC Managed Care – PPO | Admitting: Physical Therapy

## 2019-07-17 DIAGNOSIS — Z136 Encounter for screening for cardiovascular disorders: Secondary | ICD-10-CM | POA: Diagnosis not present

## 2019-07-17 DIAGNOSIS — M25511 Pain in right shoulder: Secondary | ICD-10-CM | POA: Diagnosis not present

## 2019-07-17 DIAGNOSIS — Z0189 Encounter for other specified special examinations: Secondary | ICD-10-CM | POA: Diagnosis not present

## 2019-07-17 DIAGNOSIS — E785 Hyperlipidemia, unspecified: Secondary | ICD-10-CM | POA: Diagnosis not present

## 2019-07-18 ENCOUNTER — Ambulatory Visit: Payer: BC Managed Care – PPO | Admitting: Orthopaedic Surgery

## 2019-07-18 ENCOUNTER — Encounter (HOSPITAL_COMMUNITY): Payer: BC Managed Care – PPO | Admitting: Physical Therapy

## 2020-02-24 DIAGNOSIS — R945 Abnormal results of liver function studies: Secondary | ICD-10-CM | POA: Diagnosis not present

## 2020-02-24 DIAGNOSIS — E785 Hyperlipidemia, unspecified: Secondary | ICD-10-CM | POA: Diagnosis not present

## 2020-02-24 DIAGNOSIS — Z0189 Encounter for other specified special examinations: Secondary | ICD-10-CM | POA: Diagnosis not present

## 2020-02-24 DIAGNOSIS — Z712 Person consulting for explanation of examination or test findings: Secondary | ICD-10-CM | POA: Diagnosis not present

## 2020-02-27 DIAGNOSIS — I499 Cardiac arrhythmia, unspecified: Secondary | ICD-10-CM | POA: Diagnosis not present

## 2020-02-27 DIAGNOSIS — R945 Abnormal results of liver function studies: Secondary | ICD-10-CM | POA: Diagnosis not present

## 2020-02-27 DIAGNOSIS — E785 Hyperlipidemia, unspecified: Secondary | ICD-10-CM | POA: Diagnosis not present

## 2020-02-27 DIAGNOSIS — Z23 Encounter for immunization: Secondary | ICD-10-CM | POA: Diagnosis not present

## 2020-02-27 DIAGNOSIS — M25511 Pain in right shoulder: Secondary | ICD-10-CM | POA: Diagnosis not present

## 2020-05-22 DIAGNOSIS — E785 Hyperlipidemia, unspecified: Secondary | ICD-10-CM | POA: Diagnosis not present

## 2020-05-22 DIAGNOSIS — R945 Abnormal results of liver function studies: Secondary | ICD-10-CM | POA: Diagnosis not present

## 2020-05-22 DIAGNOSIS — Z712 Person consulting for explanation of examination or test findings: Secondary | ICD-10-CM | POA: Diagnosis not present

## 2020-05-22 DIAGNOSIS — Z0189 Encounter for other specified special examinations: Secondary | ICD-10-CM | POA: Diagnosis not present

## 2020-05-28 DIAGNOSIS — I499 Cardiac arrhythmia, unspecified: Secondary | ICD-10-CM | POA: Diagnosis not present

## 2020-05-28 DIAGNOSIS — E785 Hyperlipidemia, unspecified: Secondary | ICD-10-CM | POA: Diagnosis not present

## 2020-05-28 DIAGNOSIS — M25511 Pain in right shoulder: Secondary | ICD-10-CM | POA: Diagnosis not present

## 2020-05-28 DIAGNOSIS — R945 Abnormal results of liver function studies: Secondary | ICD-10-CM | POA: Diagnosis not present

## 2020-11-30 DIAGNOSIS — E782 Mixed hyperlipidemia: Secondary | ICD-10-CM | POA: Diagnosis not present

## 2020-11-30 DIAGNOSIS — Z131 Encounter for screening for diabetes mellitus: Secondary | ICD-10-CM | POA: Diagnosis not present

## 2020-11-30 DIAGNOSIS — Z0000 Encounter for general adult medical examination without abnormal findings: Secondary | ICD-10-CM | POA: Diagnosis not present

## 2020-12-07 DIAGNOSIS — Z125 Encounter for screening for malignant neoplasm of prostate: Secondary | ICD-10-CM | POA: Diagnosis not present

## 2020-12-07 DIAGNOSIS — E782 Mixed hyperlipidemia: Secondary | ICD-10-CM | POA: Diagnosis not present

## 2020-12-07 DIAGNOSIS — F17200 Nicotine dependence, unspecified, uncomplicated: Secondary | ICD-10-CM | POA: Diagnosis not present

## 2020-12-07 DIAGNOSIS — R748 Abnormal levels of other serum enzymes: Secondary | ICD-10-CM | POA: Diagnosis not present

## 2021-05-13 DIAGNOSIS — R569 Unspecified convulsions: Secondary | ICD-10-CM | POA: Diagnosis not present

## 2021-05-13 DIAGNOSIS — E782 Mixed hyperlipidemia: Secondary | ICD-10-CM | POA: Diagnosis not present

## 2021-05-13 DIAGNOSIS — R7303 Prediabetes: Secondary | ICD-10-CM | POA: Diagnosis not present

## 2021-05-13 DIAGNOSIS — I10 Essential (primary) hypertension: Secondary | ICD-10-CM | POA: Diagnosis not present

## 2021-11-10 DIAGNOSIS — E782 Mixed hyperlipidemia: Secondary | ICD-10-CM | POA: Diagnosis not present

## 2021-11-17 DIAGNOSIS — Z125 Encounter for screening for malignant neoplasm of prostate: Secondary | ICD-10-CM | POA: Diagnosis not present

## 2021-11-17 DIAGNOSIS — R748 Abnormal levels of other serum enzymes: Secondary | ICD-10-CM | POA: Diagnosis not present

## 2021-11-17 DIAGNOSIS — F17200 Nicotine dependence, unspecified, uncomplicated: Secondary | ICD-10-CM | POA: Diagnosis not present

## 2021-11-17 DIAGNOSIS — E782 Mixed hyperlipidemia: Secondary | ICD-10-CM | POA: Diagnosis not present

## 2021-12-14 DIAGNOSIS — J069 Acute upper respiratory infection, unspecified: Secondary | ICD-10-CM | POA: Diagnosis not present

## 2022-06-14 DIAGNOSIS — R7301 Impaired fasting glucose: Secondary | ICD-10-CM | POA: Diagnosis not present

## 2022-06-14 DIAGNOSIS — Z125 Encounter for screening for malignant neoplasm of prostate: Secondary | ICD-10-CM | POA: Diagnosis not present

## 2022-06-14 DIAGNOSIS — E782 Mixed hyperlipidemia: Secondary | ICD-10-CM | POA: Diagnosis not present

## 2022-06-20 DIAGNOSIS — E782 Mixed hyperlipidemia: Secondary | ICD-10-CM | POA: Diagnosis not present

## 2022-06-20 DIAGNOSIS — F17200 Nicotine dependence, unspecified, uncomplicated: Secondary | ICD-10-CM | POA: Diagnosis not present

## 2022-06-20 DIAGNOSIS — R7301 Impaired fasting glucose: Secondary | ICD-10-CM | POA: Diagnosis not present

## 2022-06-20 DIAGNOSIS — R55 Syncope and collapse: Secondary | ICD-10-CM | POA: Diagnosis not present

## 2022-06-20 DIAGNOSIS — R748 Abnormal levels of other serum enzymes: Secondary | ICD-10-CM | POA: Diagnosis not present

## 2022-06-20 DIAGNOSIS — R7401 Elevation of levels of liver transaminase levels: Secondary | ICD-10-CM | POA: Diagnosis not present

## 2022-06-24 ENCOUNTER — Encounter: Payer: Self-pay | Admitting: *Deleted

## 2022-06-27 ENCOUNTER — Encounter: Payer: Self-pay | Admitting: Internal Medicine

## 2022-06-27 ENCOUNTER — Ambulatory Visit: Payer: BC Managed Care – PPO | Admitting: Internal Medicine

## 2022-06-27 NOTE — Progress Notes (Signed)
Erroneous encounter - please disregard.

## 2022-06-27 NOTE — Patient Instructions (Signed)
Medication Instructions:     Labwork:    Testing/Procedures:    Follow-Up:  Your physician recommends that you schedule a follow-up appointment in:   Any Other Special Instructions Will Be Listed Below (If Applicable).  If you need a refill on your cardiac medications before your next appointment, please call your pharmacy. 

## 2022-12-14 DIAGNOSIS — E782 Mixed hyperlipidemia: Secondary | ICD-10-CM | POA: Diagnosis not present

## 2022-12-14 DIAGNOSIS — R7301 Impaired fasting glucose: Secondary | ICD-10-CM | POA: Diagnosis not present

## 2022-12-20 DIAGNOSIS — R748 Abnormal levels of other serum enzymes: Secondary | ICD-10-CM | POA: Diagnosis not present

## 2022-12-20 DIAGNOSIS — F17200 Nicotine dependence, unspecified, uncomplicated: Secondary | ICD-10-CM | POA: Diagnosis not present

## 2022-12-20 DIAGNOSIS — E782 Mixed hyperlipidemia: Secondary | ICD-10-CM | POA: Diagnosis not present

## 2022-12-20 DIAGNOSIS — R7301 Impaired fasting glucose: Secondary | ICD-10-CM | POA: Diagnosis not present
